# Patient Record
Sex: Male | Born: 1950 | Hispanic: No | Marital: Married | Smoking: Former smoker
Health system: Southern US, Community
[De-identification: ages and names within clinical notes are randomized; demographics above are authoritative.]

## PROBLEM LIST (undated history)

## (undated) DIAGNOSIS — I10 Essential (primary) hypertension: Secondary | ICD-10-CM

## (undated) DIAGNOSIS — E119 Type 2 diabetes mellitus without complications: Secondary | ICD-10-CM

## (undated) HISTORY — PX: BACK SURGERY: SHX140

---

## 1999-01-30 ENCOUNTER — Encounter: Payer: Self-pay | Admitting: Neurological Surgery

## 1999-01-30 ENCOUNTER — Inpatient Hospital Stay (HOSPITAL_COMMUNITY): Admission: RE | Admit: 1999-01-30 | Discharge: 1999-01-31 | Payer: Self-pay | Admitting: Neurological Surgery

## 2008-12-07 ENCOUNTER — Inpatient Hospital Stay: Payer: Self-pay | Admitting: Internal Medicine

## 2011-05-24 ENCOUNTER — Ambulatory Visit: Payer: Self-pay | Admitting: Internal Medicine

## 2011-05-25 ENCOUNTER — Ambulatory Visit: Payer: Self-pay | Admitting: Internal Medicine

## 2011-05-28 ENCOUNTER — Ambulatory Visit: Payer: Self-pay | Admitting: Internal Medicine

## 2011-09-18 ENCOUNTER — Other Ambulatory Visit: Payer: Self-pay | Admitting: Internal Medicine

## 2011-09-19 ENCOUNTER — Ambulatory Visit: Payer: Self-pay | Admitting: Internal Medicine

## 2013-01-05 ENCOUNTER — Ambulatory Visit: Payer: Self-pay | Admitting: Internal Medicine

## 2013-01-14 ENCOUNTER — Emergency Department: Payer: Self-pay | Admitting: Emergency Medicine

## 2013-01-14 LAB — BASIC METABOLIC PANEL
Anion Gap: 12 (ref 7–16)
BUN: 25 mg/dL — ABNORMAL HIGH (ref 7–18)
Calcium, Total: 8.6 mg/dL (ref 8.5–10.1)
Chloride: 100 mmol/L (ref 98–107)
Co2: 23 mmol/L (ref 21–32)
Creatinine: 1.54 mg/dL — ABNORMAL HIGH (ref 0.60–1.30)
EGFR (African American): 56 — ABNORMAL LOW
EGFR (Non-African Amer.): 48 — ABNORMAL LOW
Glucose: 199 mg/dL — ABNORMAL HIGH (ref 65–99)
Osmolality: 280 (ref 275–301)
Potassium: 5.2 mmol/L — ABNORMAL HIGH (ref 3.5–5.1)
Sodium: 135 mmol/L — ABNORMAL LOW (ref 136–145)

## 2013-01-14 LAB — URINALYSIS, COMPLETE
Bilirubin,UR: NEGATIVE
Blood: NEGATIVE
Glucose,UR: 50 mg/dL (ref 0–75)
Hyaline Cast: 4
Ketone: NEGATIVE
Leukocyte Esterase: NEGATIVE
Nitrite: NEGATIVE
Ph: 5 (ref 4.5–8.0)
Protein: NEGATIVE
RBC,UR: 1 /HPF (ref 0–5)
Specific Gravity: 1.015 (ref 1.003–1.030)
Squamous Epithelial: NONE SEEN
WBC UR: 4 /HPF (ref 0–5)

## 2013-01-14 LAB — CBC
HCT: 47.1 % (ref 40.0–52.0)
HGB: 16.2 g/dL (ref 13.0–18.0)
MCH: 29.9 pg (ref 26.0–34.0)
MCHC: 34.3 g/dL (ref 32.0–36.0)
MCV: 87 fL (ref 80–100)
Platelet: 218 10*3/uL (ref 150–440)
RBC: 5.41 10*6/uL (ref 4.40–5.90)
RDW: 13.9 % (ref 11.5–14.5)
WBC: 8.9 10*3/uL (ref 3.8–10.6)

## 2013-01-14 LAB — TROPONIN I: Troponin-I: <0.02 ng/mL

## 2013-01-14 LAB — PRO B NATRIURETIC PEPTIDE: B-Type Natriuretic Peptide: 60 pg/mL (ref 0–125)

## 2013-01-17 IMAGING — US US CAROTID DUPLEX BILAT
1 series · 17 of 24 positions shown · non-contrast
Comparison: none

REASON FOR EXAM: fatigue dizziness
COMMENTS:

[Series 1: us carotid duplex bilat · 17 of 63 slices shown]
[im 1/63]
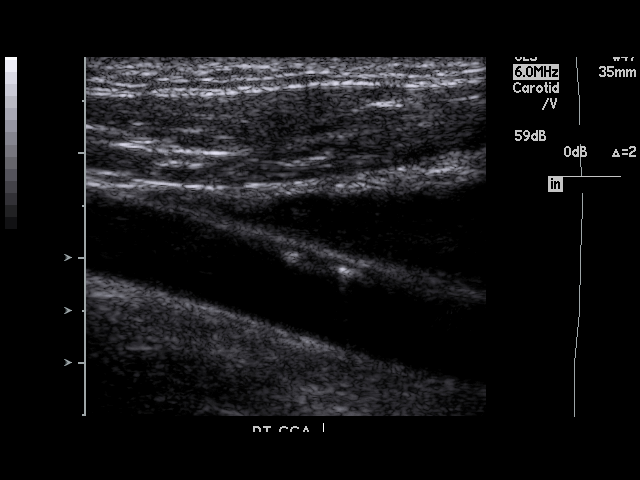
[im 6/63]
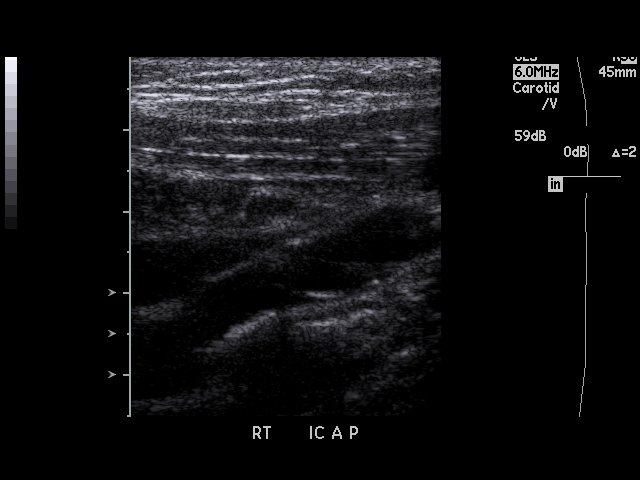
[im 9/63]
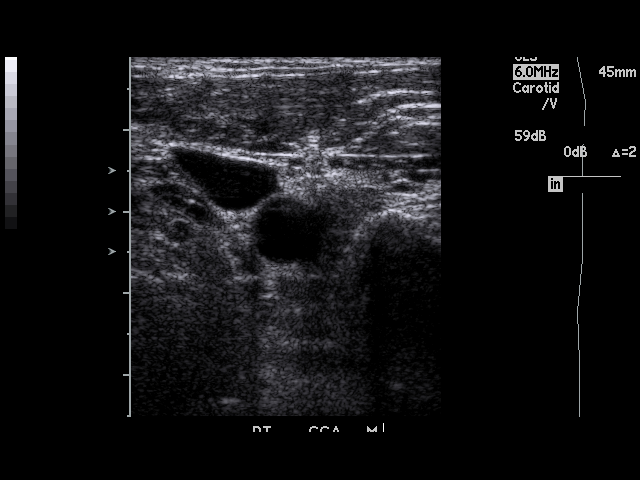
[im 11/63]
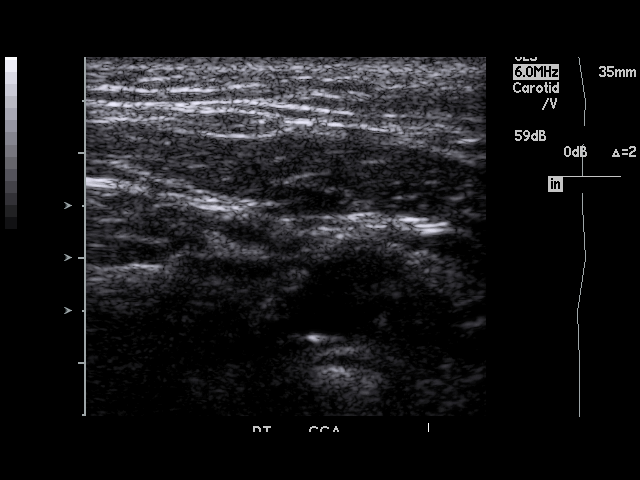
[im 17/63]
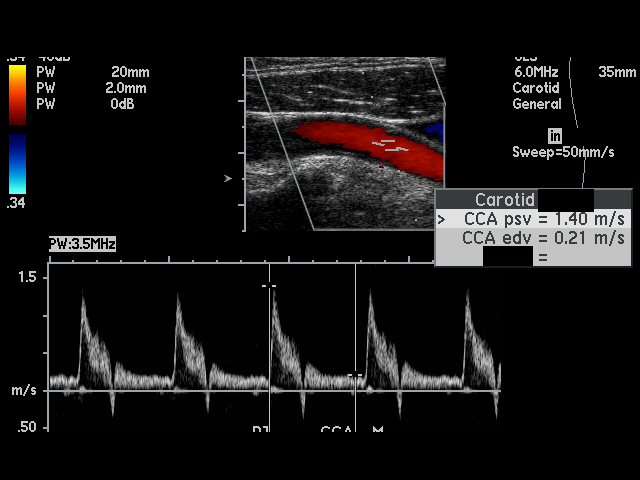
[im 19/63]
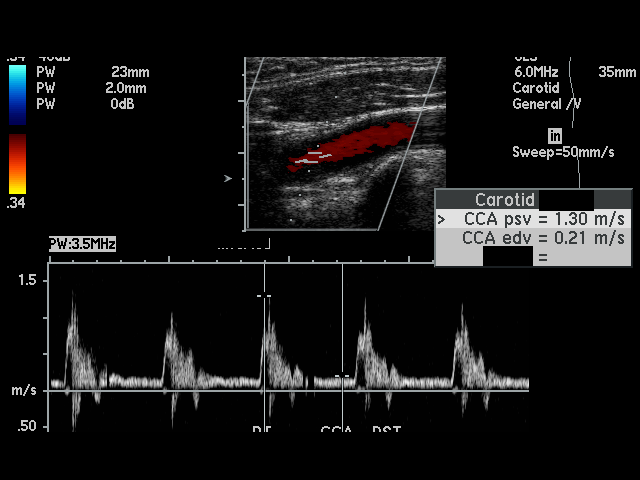
[im 25/63]
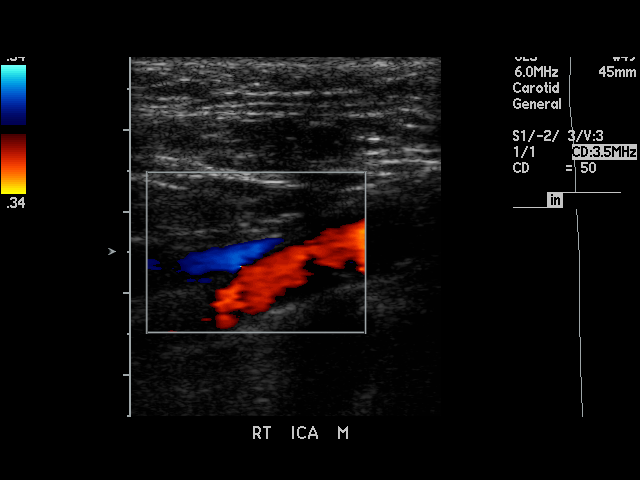
[im 27/63]
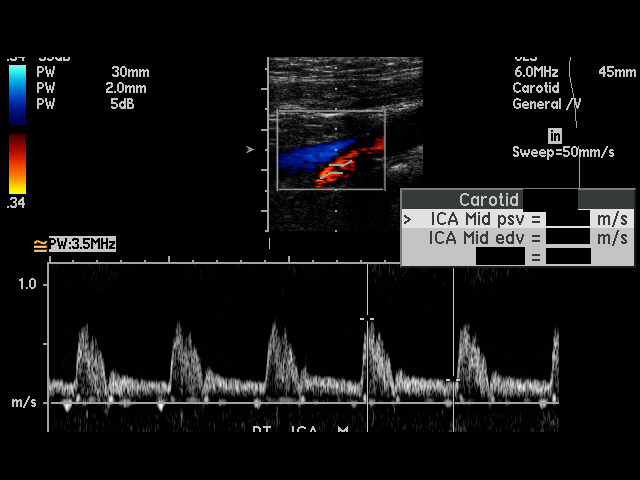
[im 33/63]
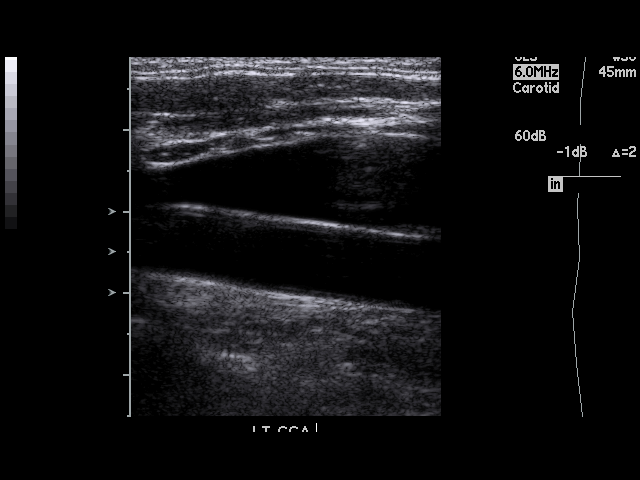
[im 36/63]
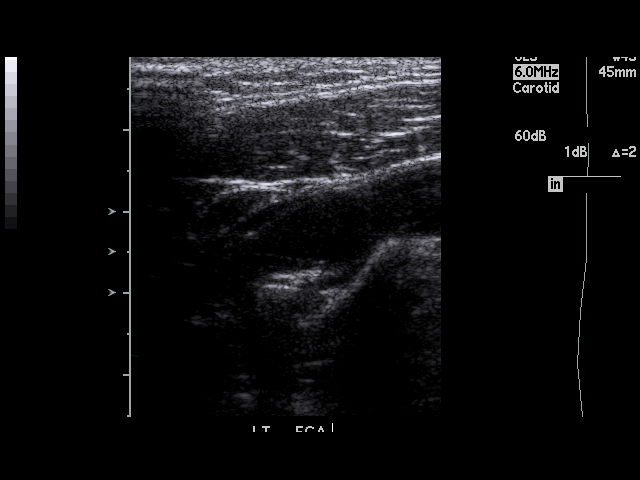
[im 38/63]
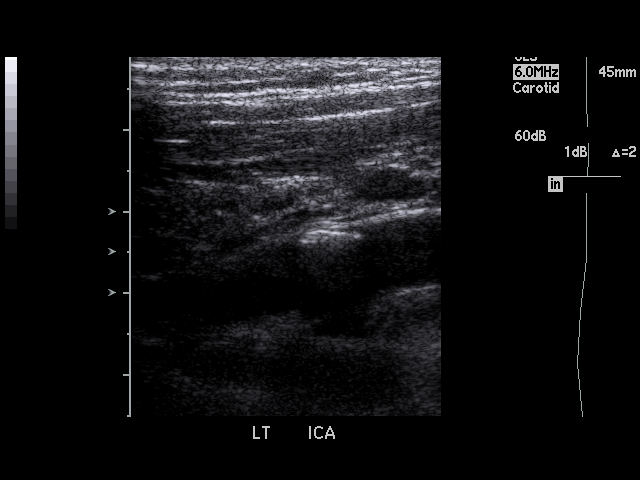
[im 44/63]
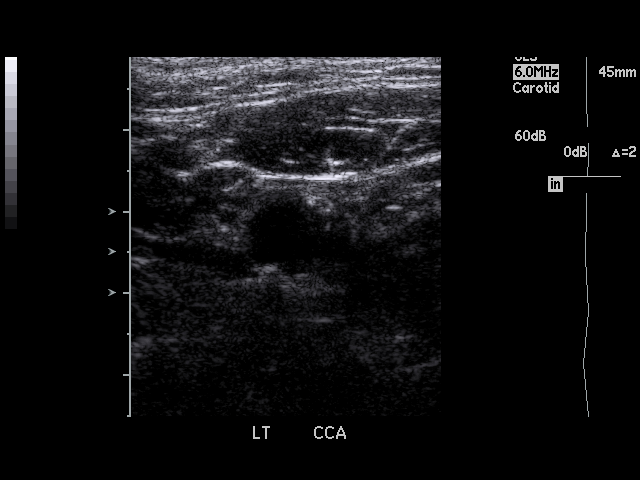
[im 46/63]
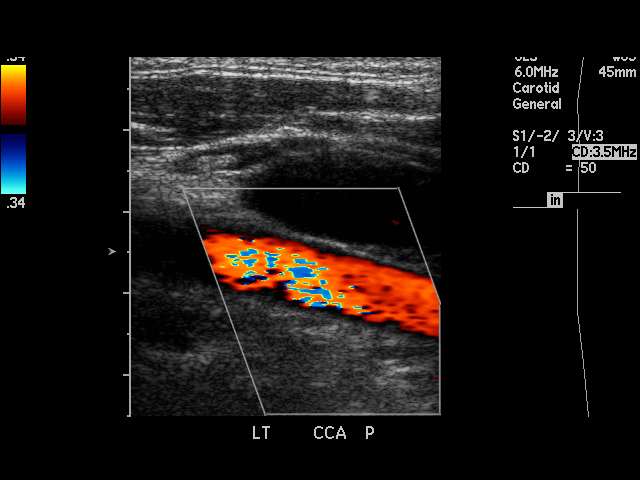
[im 52/63]
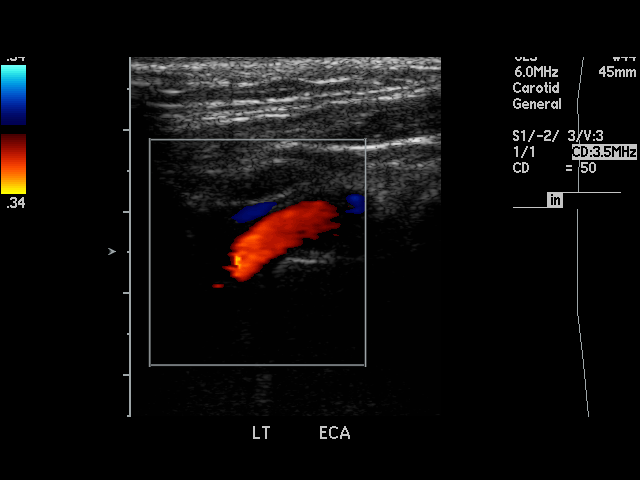
[im 54/63]
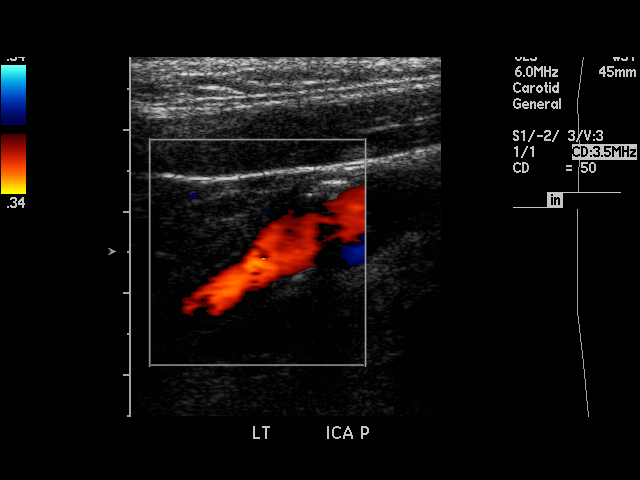
[im 57/63]
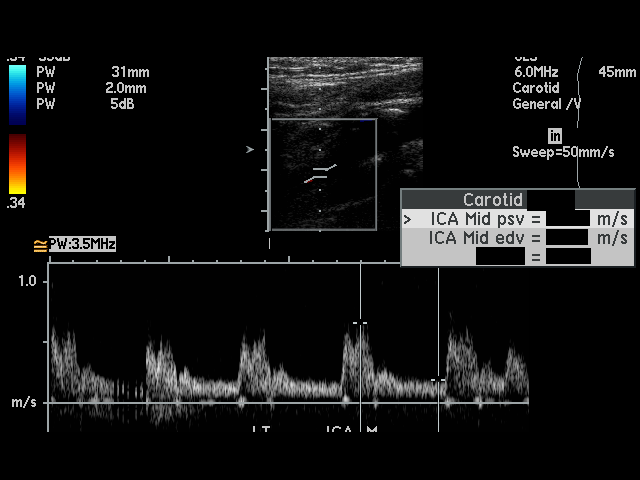
[im 63/63]
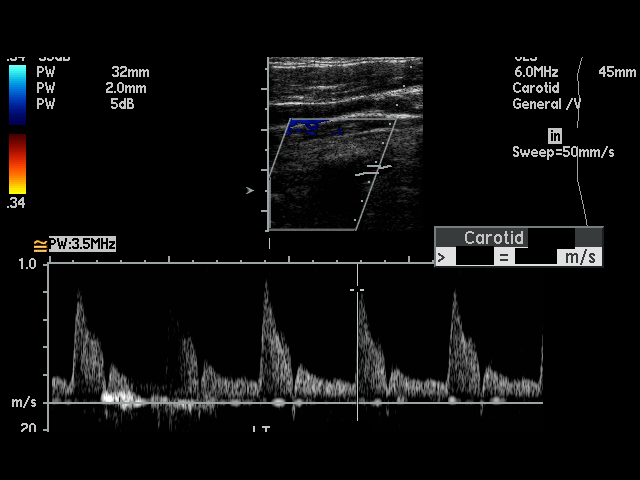

[17 of 24 positions shown; findings below may reference images not displayed]

PROCEDURE:     US  - US CAROTID DOPPLER BILATERAL  - May 28, 2011 [DATE]

RESULT:     There is observed a small amount of smooth and calcific plaque
formation about the carotid bifurcations bilaterally. In the distal right
common carotid, there is a linear density projecting into the lumen. This
does not move. The etiology for this is to me uncertain. Possibly this
represents intima that is interrupted and elevated by thrombus or soft
plaque formation. No significant stenosis is seen in the area. The finding
could be further evaluated by CTA or MRA, if clinically indicated.

On the right, the peak right common carotid artery flow velocity measures
1.3 m/sec and the peak right internal carotid artery flow velocity measures
0.717 m/sec. The ICA/CCA ratio is 0.552.

On the left, the peak left common carotid artery flow velocity measures
m/sec and the peak left internal carotid artery flow velocity measures
m/sec. The ICA/CCA ratio a 0.934.

These values bilaterally are in the normal range and are consistent with the
absence of hemodynamically significant stenosis.

There is observed antegrade flow in both vertebrals.
IMPRESSION: 1.  No hemodynamically significant stenosis is observed on either side.
2.  There is antegrade flow in both vertebrals.
3.  There is a small amount of plaque formation observed bilaterally.
4.  There is a linear echo density that projects into the lumen of the
distal right common carotid for a distance of a few millimeters. The
etiology for this is to me uncertain but may represent a focal area of
elevated intima posterior to which there is thrombus or soft plaque. The
finding could possibly be further evaluated by CTA or MRA, if clinically
indicated.

## 2017-11-18 ENCOUNTER — Encounter: Payer: Self-pay | Admitting: Emergency Medicine

## 2017-11-18 ENCOUNTER — Emergency Department
Admission: EM | Admit: 2017-11-18 | Discharge: 2017-11-18 | Disposition: A | Discharge status: Home / Self Care | Payer: Medicare Other | Attending: Student in an Organized Health Care Education/Training Program | Admitting: Student in an Organized Health Care Education/Training Program

## 2017-11-18 ENCOUNTER — Other Ambulatory Visit: Payer: Self-pay

## 2017-11-18 ENCOUNTER — Emergency Department: Payer: Medicare Other

## 2017-11-18 DIAGNOSIS — Z87891 Personal history of nicotine dependence: Secondary | ICD-10-CM | POA: Diagnosis not present

## 2017-11-18 DIAGNOSIS — E119 Type 2 diabetes mellitus without complications: Secondary | ICD-10-CM | POA: Diagnosis not present

## 2017-11-18 DIAGNOSIS — Z7982 Long term (current) use of aspirin: Secondary | ICD-10-CM | POA: Insufficient documentation

## 2017-11-18 DIAGNOSIS — R0602 Shortness of breath: Secondary | ICD-10-CM | POA: Insufficient documentation

## 2017-11-18 DIAGNOSIS — R0789 Other chest pain: Secondary | ICD-10-CM | POA: Insufficient documentation

## 2017-11-18 DIAGNOSIS — I10 Essential (primary) hypertension: Secondary | ICD-10-CM | POA: Diagnosis not present

## 2017-11-18 DIAGNOSIS — R000 Tachycardia, unspecified: Secondary | ICD-10-CM | POA: Diagnosis not present

## 2017-11-18 DIAGNOSIS — Z79899 Other long term (current) drug therapy: Secondary | ICD-10-CM | POA: Diagnosis not present

## 2017-11-18 DIAGNOSIS — I159 Secondary hypertension, unspecified: Secondary | ICD-10-CM | POA: Diagnosis not present

## 2017-11-18 DIAGNOSIS — R079 Chest pain, unspecified: Secondary | ICD-10-CM | POA: Diagnosis not present

## 2017-11-18 DIAGNOSIS — Z794 Long term (current) use of insulin: Secondary | ICD-10-CM | POA: Diagnosis not present

## 2017-11-18 HISTORY — DX: Type 2 diabetes mellitus without complications: E11.9

## 2017-11-18 HISTORY — DX: Essential (primary) hypertension: I10

## 2017-11-18 LAB — COMPREHENSIVE METABOLIC PANEL
ALK PHOS: 54 U/L (ref 38–126)
ALT: 96 U/L — ABNORMAL HIGH (ref 17–63)
ANION GAP: 13 (ref 5–15)
AST: 56 U/L — ABNORMAL HIGH (ref 15–41)
Albumin: 4.3 g/dL (ref 3.5–5.0)
BILIRUBIN TOTAL: 0.5 mg/dL (ref 0.3–1.2)
BUN: 10 mg/dL (ref 6–20)
CALCIUM: 9.3 mg/dL (ref 8.9–10.3)
CO2: 26 mmol/L (ref 22–32)
Chloride: 89 mmol/L — ABNORMAL LOW (ref 101–111)
Creatinine, Ser: 0.84 mg/dL (ref 0.61–1.24)
GFR calc Af Amer: >60 mL/min (ref >60)
GLUCOSE: 254 mg/dL — AB (ref 65–99)
Potassium: 3.8 mmol/L (ref 3.5–5.1)
Sodium: 128 mmol/L — ABNORMAL LOW (ref 135–145)
TOTAL PROTEIN: 7.7 g/dL (ref 6.5–8.1)

## 2017-11-18 LAB — TROPONIN I
Troponin I: <0.03 ng/mL (ref <0.03)
Troponin I: <0.03 ng/mL (ref <0.03)

## 2017-11-18 LAB — CBC
HEMATOCRIT: 49 % (ref 40.0–52.0)
Hemoglobin: 16.7 g/dL (ref 13.0–18.0)
MCH: 29.2 pg (ref 26.0–34.0)
MCHC: 34.1 g/dL (ref 32.0–36.0)
MCV: 85.5 fL (ref 80.0–100.0)
PLATELETS: 239 10*3/uL (ref 150–440)
RBC: 5.73 MIL/uL (ref 4.40–5.90)
RDW: 15 % — AB (ref 11.5–14.5)
WBC: 9.5 10*3/uL (ref 3.8–10.6)

## 2017-11-18 LAB — FIBRIN DERIVATIVES D-DIMER (ARMC ONLY): FIBRIN DERIVATIVES D-DIMER (ARMC): 351.18 ng{FEU}/mL (ref 0.00–499.00)

## 2017-11-18 MED ORDER — PREDNISONE 10 MG PO TABS: 10.0000 mg | ORAL_TABLET | Freq: Every day | ORAL | 0 refills | Status: DC

## 2017-11-18 MED ORDER — AMLODIPINE BESYLATE 5 MG PO TABS: 5.0000 mg | ORAL_TABLET | Freq: Every day | ORAL | 0 refills | Status: AC

## 2017-11-18 MED ORDER — HYDROCODONE-ACETAMINOPHEN 5-325 MG PO TABS: 1.0000 | ORAL_TABLET | Freq: Once | ORAL | Status: DC

## 2017-11-18 MED ORDER — SODIUM CHLORIDE 0.9 % IV BOLUS (SEPSIS)
500.0000 mL | Freq: Once | INTRAVENOUS | Status: AC
  Administered 2017-11-18: 500 mL via INTRAVENOUS

## 2017-11-18 MED ORDER — IPRATROPIUM-ALBUTEROL 0.5-2.5 (3) MG/3ML IN SOLN
3.0000 mL | Freq: Once | RESPIRATORY_TRACT | Status: AC
  Administered 2017-11-18: 3 mL via RESPIRATORY_TRACT
  Filled 2017-11-18: qty 3

## 2017-11-18 MED ORDER — AMLODIPINE BESYLATE 5 MG PO TABS
10.0000 mg | ORAL_TABLET | Freq: Once | ORAL | Status: AC
  Administered 2017-11-18: 10 mg via ORAL
  Filled 2017-11-18: qty 2

## 2017-11-18 NOTE — ED Provider Notes (Signed)
[facility] Emergency Department Provider Note    First MD Initiated Contact with Patient 11/18/17 1158     (approximate)  I have reviewed the triage vital signs and the nursing notes.   HISTORY  Chief Complaint Chest Pain    HPI Hector Braun is a 67 y.o. male a history of diabetes and hypertension presents with several months of intermittent anterior chest pain associated with shortness of breath.  Became more severe this morning which brought him to the ER.  States he was sitting at home and went to go get wood from the fire stack roughly 10-15 feet away from his chair.  He picked at the water and threw it down and sat down and started feeling short of breath.  Was complaining of associated sharp stabbing chest pain.  No diaphoresis nausea or vomiting.  Does have a significant history of smoking.  Does not currently smoke.  Denies any history of heart attack.  Recently has had multiple medication changes for his blood pressure management.  Blood pressure checked this morning was 200/100.  No lower extremity swelling.  No orthopnea.  Past Medical History:  Diagnosis Date  . Diabetes mellitus without complication (HCC)   . Hypertension    No family history on file. Past Surgical History:  Procedure Laterality Date  . BACK SURGERY     There are no active problems to display for this patient.     Prior to Admission medications   Medication Sig Start Date End Date Taking? Authorizing Provider  albuterol (PROVENTIL HFA;VENTOLIN HFA) 108 (90 Base) MCG/ACT inhaler Inhale 2 puffs into the lungs every 6 (six) hours as needed for wheezing or shortness of breath.   Yes [provider]  aspirin EC 81 MG tablet Take 81 mg by mouth daily.   Yes [provider]  cholecalciferol (VITAMIN D) 1000 units tablet Take 2,000 Units by mouth daily.   Yes [provider]  hydrochlorothiazide (HYDRODIURIL) 25 MG tablet Take 25 mg by mouth daily.    Yes [provider]  insulin aspart (NOVOLOG) 100 UNIT/ML injection Inject 10-15 Units into the skin 3 (three) times daily before meals.   Yes [provider]  insulin detemir (LEVEMIR) 100 UNIT/ML injection Inject 60-80 Units into the skin 2 (two) times daily. 60UNITS-AM/80UNITS-PM   Yes [provider]  losartan (COZAAR) 50 MG tablet Take 50 mg by mouth daily.   Yes [provider]  metFORMIN (GLUCOPHAGE) 1000 MG tablet Take 1,000 mg by mouth 2 (two) times daily with a meal.   Yes [provider]  metoprolol tartrate (LOPRESSOR) 25 MG tablet Take 25 mg by mouth 2 (two) times daily.   Yes [provider]  omeprazole (PRILOSEC) 20 MG capsule Take 20 mg by mouth daily.   Yes [provider]  predniSONE (DELTASONE) 10 MG tablet Take 1 tablet (10 mg total) by mouth daily. Day 1-2: Take 50 mg  ( 5 pills) Day 3-4 : Take 40 mg (4pills) Day 5-6: Take 30 mg (3 pills) Day 7-8:  Take 20 mg (2 pills) Day 9:  Take 10mg  (1 pill) 11/18/17   Hector Eddy, MD    Allergies Lisinopril    Social History Social History   Tobacco Use  . Smoking status: Former Smoker  Substance Use Topics  . Alcohol use: Not on file  . Drug use: Not on file    Review of Systems Patient denies headaches, rhinorrhea, blurry vision, numbness, shortness of  breath, chest pain, edema, cough, abdominal pain, nausea, vomiting, diarrhea, dysuria, fevers, rashes or hallucinations unless otherwise stated above in HPI. ____________________________________________   PHYSICAL EXAM:  VITAL SIGNS: Vitals:   11/18/17 1430 11/18/17 1515  BP: (!) 149/80 (!) 161/94  Pulse: 85 97  Resp: (!) 21 (!) 23  Temp:    SpO2: 97% 97%    Constitutional: Alert and oriented. Well appearing and in no acute distress. Eyes: Conjunctivae are normal.  Head: Atraumatic. Nose: No congestion/rhinnorhea. Mouth/Throat: Mucous membranes are moist.   Neck: No stridor. Painless ROM.    Cardiovascular: Normal rate, regular rhythm. Grossly normal heart sounds.  Good peripheral circulation. Respiratory: Normal respiratory effort.  No retractions. Lungs with coarse breathsounds throughout Gastrointestinal: Soft and nontender. No distention. No abdominal bruits. No CVA tenderness. Genitourinary:  Musculoskeletal: No lower extremity tenderness nor edema.  No joint effusions. Neurologic:  Normal speech and language. No gross focal neurologic deficits are appreciated. No facial droop Skin:  Skin is warm, dry and intact. No rash noted. Psychiatric: Mood and affect are normal. Speech and behavior are normal.  ____________________________________________   LABS (all labs ordered are listed, but only abnormal results are displayed)  Results for orders placed or performed during the hospital encounter of 11/18/17 (from the past 24 hour(s))  CBC     Status: Abnormal   Collection Time: 11/18/17 11:34 AM  Result Value Ref Range   WBC 9.5 3.8 - 10.6 K/uL   RBC 5.73 4.40 - 5.90 MIL/uL   Hemoglobin 16.7 13.0 - 18.0 g/dL   HCT 16.149.0 09.640.0 - 04.552.0 %   MCV 85.5 80.0 - 100.0 fL   MCH 29.2 26.0 - 34.0 pg   MCHC 34.1 32.0 - 36.0 g/dL   RDW 40.915.0 (H) 81.111.5 - 91.414.5 %   Platelets 239 150 - 440 K/uL  Troponin I     Status: None   Collection Time: 11/18/17 11:34 AM  Result Value Ref Range   Troponin I <0.03 <0.03 ng/mL  Comprehensive metabolic panel     Status: Abnormal   Collection Time: 11/18/17 11:34 AM  Result Value Ref Range   Sodium 128 (L) 135 - 145 mmol/L   Potassium 3.8 3.5 - 5.1 mmol/L   Chloride 89 (L) 101 - 111 mmol/L   CO2 26 22 - 32 mmol/L   Glucose, Bld 254 (H) 65 - 99 mg/dL   BUN 10 6 - 20 mg/dL   Creatinine, Ser 7.820.84 0.61 - 1.24 mg/dL   Calcium 9.3 8.9 - 95.610.3 mg/dL   Total Protein 7.7 6.5 - 8.1 g/dL   Albumin 4.3 3.5 - 5.0 g/dL   AST 56 (H) 15 - 41 U/L   ALT 96 (H) 17 - 63 U/L   Alkaline Phosphatase 54 38 - 126 U/L   Total Bilirubin 0.5 0.3 - 1.2 mg/dL   GFR calc non  Af Amer >60 >60 mL/min   GFR calc Af Amer >60 >60 mL/min   Anion gap 13 5 - 15  Fibrin derivatives D-Dimer ([facility] only)     Status: None   Collection Time: 11/18/17 12:37 PM  Result Value Ref Range   Fibrin derivatives D-dimer ([facility]) 351.18 0.00 - 499.00 ng/mL (FEU)  Troponin I     Status: None   Collection Time: 11/18/17  2:43 PM  Result Value Ref Range   Troponin I <0.03 <0.03 ng/mL   ____________________________________________  EKG My review and personal interpretation at Time: 11:29   Indication: htn  Rate: 11-  Rhythm: sinus Axis: normal Other: normal intervals, no stemi, sinus dysrhythmia ____________________________________________  RADIOLOGY  I personally reviewed all radiographic images ordered to evaluate for the above acute complaints and reviewed radiology reports and findings.  These findings were personally discussed with the patient.  Please see medical record for radiology report.  ____________________________________________   PROCEDURES  Procedure(s) performed:  Procedures    Critical Care performed: no ____________________________________________   INITIAL IMPRESSION / ASSESSMENT AND PLAN / ED COURSE  Pertinent labs & imaging results that were available during my care of the patient were reviewed by me and considered in my medical decision making (see chart for details).  DDX: ACS, pericarditis, esophagitis, boerhaaves, pe, dissection, pna, bronchitis, costochondritis   Hector Braun is a 67 y.o. who presents to the ED with chest pain as described above.  Patient is hypertensive but in no acute distress.  No hypoxia or evidence of respiratory distress.  Very atypical description chest pain.  Patient with heart score of 3 versus 4 based on subjectivity therefore will repeat troponin to further risk stratify.  EKG shows no evidence of acute ischemia.  Patient is low risk by Wells criteria.  Will order d-dimer to further stratify for pulmonary embolism.   Not clinically consistent with dissection.  The patient will be placed on continuous pulse oximetry and telemetry for monitoring.  Laboratory evaluation will be sent to evaluate for the above complaints.     Clinical Course as of Nov 18 1532  Mon Nov 18, 2017  1355 D-dimer is negative.  Currently awaiting troponin.  Patient remains Heema dynamically stable.  Blood pressure did improve after simple observation.  [PR]  1521 Troponin is negative.  At this point do believe patient is stable and appropriate for follow-up as an outpatient.  He is chest pain-free.  KG shows no acute changes.  Patient able to ambulate with steady gait.  Have discussed with the patient and available family all diagnostics and treatments performed thus far and all questions were answered to the best of my ability. The patient demonstrates understanding and agreement with plan.   [PR]    Clinical Course User Index [PR] Hector Eddy, MD     ____________________________________________   FINAL CLINICAL IMPRESSION(S) / ED DIAGNOSES  Final diagnoses:  Chest pain, unspecified type  Secondary hypertension      NEW MEDICATIONS STARTED DURING THIS VISIT:  New Prescriptions   PREDNISONE (DELTASONE) 10 MG TABLET    Take 1 tablet (10 mg total) by mouth daily. Day 1-2: Take 50 mg  ( 5 pills) Day 3-4 : Take 40 mg (4pills) Day 5-6: Take 30 mg (3 pills) Day 7-8:  Take 20 mg (2 pills) Day 9:  Take 10mg  (1 pill)     Note:  This document was prepared using Dragon voice recognition software and may include unintentional dictation errors.    Hector Eddy, MD 11/18/17 1534

## 2017-11-18 NOTE — ED Notes (Signed)
BP med was given.

## 2017-11-18 NOTE — ED Triage Notes (Addendum)
Chest pain increased this am, states has had some chest pain for months. States has episodes at home of feeling like his heart rate speeds up, monitors his heart rate at that time and notes irregular heartrate and blood pressure dropping. Lasts for 10 min to 2 hours.

## 2017-11-19 ENCOUNTER — Telehealth: Payer: Self-pay

## 2017-11-19 NOTE — Telephone Encounter (Signed)
Spoke to patient He states he does not want to make a follow up   Nothing else needed.

## 2018-04-15 ENCOUNTER — Ambulatory Visit: Payer: Self-pay | Admitting: Podiatry

## 2018-04-17 ENCOUNTER — Encounter: Payer: Self-pay | Admitting: Podiatry

## 2018-04-17 ENCOUNTER — Ambulatory Visit (INDEPENDENT_AMBULATORY_CARE_PROVIDER_SITE_OTHER): Payer: Medicare Other | Admitting: Podiatry

## 2018-04-17 DIAGNOSIS — B351 Tinea unguium: Secondary | ICD-10-CM

## 2018-04-17 DIAGNOSIS — M79675 Pain in left toe(s): Secondary | ICD-10-CM | POA: Diagnosis not present

## 2018-04-17 DIAGNOSIS — M79674 Pain in right toe(s): Secondary | ICD-10-CM | POA: Diagnosis not present

## 2018-04-17 DIAGNOSIS — E1142 Type 2 diabetes mellitus with diabetic polyneuropathy: Secondary | ICD-10-CM

## 2018-04-17 NOTE — Progress Notes (Signed)
This patient presents to the office with chief complaint of long thick nails and diabetic feet.  This patient  says there  is  no pain and discomfort in their feet.  This patient says there are long thick painful nails.  These nails are painful walking and wearing shoes.  Patient has no history of infection or drainage from both feet.  Patient is unable to  self treat his own nails .  [facility]  This patient presents  to the office today for treatment of the  long nails and a foot evaluation due to history of  diabetes.  General Appearance  Alert, conversant and in no acute stress.  Vascular  Dorsalis pedis and posterior tibial  pulses are palpable  bilaterally.  Capillary return is within normal limits  bilaterally. Temperature is within normal limits  bilaterally.  Neurologic  Senn-Weinstein monofilament wire test absent   bilaterally. Muscle power within normal limits bilaterally.  Nails Thick disfigured discolored nails with subungual debris  from hallux to fifth toes bilaterally. No evidence of bacterial infection or drainage bilaterally. Pincer nails  B/L  Orthopedic  No limitations of motion of motion feet .  No crepitus or effusions noted.  DJD 1st MPJ  B/L.  Skin  normotropic skin with no porokeratosis noted bilaterally.  No signs of infections or ulcers noted.   Asymptomatic heel callus.  Onychomycosis  Diabetes with no foot complications  IE  Debride nails x 10.  A diabetic foot exam was performed and there is no evidence of any vascular pathology.  Absent LOPS noted.   RTC 3 months.   Helane GuntherGregory Latarshia Jersey DPM

## 2018-07-21 ENCOUNTER — Ambulatory Visit (INDEPENDENT_AMBULATORY_CARE_PROVIDER_SITE_OTHER): Payer: No Typology Code available for payment source | Admitting: Podiatry

## 2018-07-21 ENCOUNTER — Encounter: Payer: Self-pay | Admitting: Podiatry

## 2018-07-21 DIAGNOSIS — B351 Tinea unguium: Secondary | ICD-10-CM

## 2018-07-21 DIAGNOSIS — M79675 Pain in left toe(s): Secondary | ICD-10-CM | POA: Diagnosis not present

## 2018-07-21 DIAGNOSIS — M79674 Pain in right toe(s): Secondary | ICD-10-CM | POA: Diagnosis not present

## 2018-07-21 DIAGNOSIS — E1142 Type 2 diabetes mellitus with diabetic polyneuropathy: Secondary | ICD-10-CM | POA: Diagnosis not present

## 2018-07-21 NOTE — Progress Notes (Signed)
Complaint:  Visit Type: Patient returns to my office for continued preventative foot care services. Complaint: Patient states" my nails have grown long and thick and become painful to walk and wear shoes" Patient has been diagnosed with DM with no foot complications. The patient presents for preventative foot care services. No changes to ROS  Podiatric Exam: Vascular: dorsalis pedis and posterior tibial pulses are palpable bilateral. Capillary return is immediate. Temperature gradient is WNL. Skin turgor WNL  Sensorium: Absent  Semmes Weinstein monofilament test. Normal tactile sensation bilaterally. Nail Exam: Pt has thick disfigured discolored nails with subungual debris noted bilateral entire nail hallux through fifth toenails.  Pincer nails  B/l. Ulcer Exam: There is no evidence of ulcer or pre-ulcerative changes or infection. Orthopedic Exam: Muscle tone and strength are WNL. No limitations in general ROM. No crepitus or effusions noted. Foot type and digits show no abnormalities. Bony prominences are unremarkable. Skin: No Porokeratosis. No infection or ulcers  Diagnosis:  Onychomycosis, , Pain in right toe, pain in left toes  Treatment & Plan Procedures and Treatment: Consent by patient was obtained for treatment procedures.   Debridement of mycotic and hypertrophic toenails, 1 through 5 bilateral and clearing of subungual debris. No ulceration, no infection noted.  Return Visit-Office Procedure: Patient instructed to return to the office for a follow up visit 3 months for continued evaluation and treatment.    Helane GuntherGregory Renda Pohlman DPM

## 2018-07-24 ENCOUNTER — Telehealth: Payer: Self-pay | Admitting: Podiatry

## 2018-07-24 ENCOUNTER — Ambulatory Visit: Payer: Non-veteran care | Admitting: Podiatry

## 2018-07-24 NOTE — Telephone Encounter (Signed)
[facility] [phone] Attn: Team C

## 2018-10-20 ENCOUNTER — Encounter: Payer: Self-pay | Admitting: Podiatry

## 2018-10-20 ENCOUNTER — Ambulatory Visit (INDEPENDENT_AMBULATORY_CARE_PROVIDER_SITE_OTHER): Payer: No Typology Code available for payment source | Admitting: Podiatry

## 2018-10-20 DIAGNOSIS — M79674 Pain in right toe(s): Secondary | ICD-10-CM | POA: Diagnosis not present

## 2018-10-20 DIAGNOSIS — B351 Tinea unguium: Secondary | ICD-10-CM

## 2018-10-20 DIAGNOSIS — E1142 Type 2 diabetes mellitus with diabetic polyneuropathy: Secondary | ICD-10-CM | POA: Diagnosis not present

## 2018-10-20 DIAGNOSIS — M79675 Pain in left toe(s): Secondary | ICD-10-CM

## 2018-10-20 NOTE — Progress Notes (Signed)
Complaint:  Visit Type: Patient returns to my office for continued preventative foot care services. Complaint: Patient states" my nails have grown long and thick and become painful to walk and wear shoes" Patient has been diagnosed with DM with no foot complications. The patient presents for preventative foot care services. No changes to ROS  Podiatric Exam: Vascular: dorsalis pedis and posterior tibial pulses are minimally  palpable bilateral. Capillary return is immediate. Cold feet B/L especially toes  B/L. Skin turgor WNL  Sensorium: Absent  Semmes Weinstein monofilament test. Normal tactile sensation bilaterally. Nail Exam: Pt has thick disfigured discolored nails with subungual debris noted bilateral entire nail hallux through fifth toenails.  Pincer nails  B/l. Ulcer Exam: There is no evidence of ulcer or pre-ulcerative changes or infection. Orthopedic Exam: Muscle tone and strength are WNL. No limitations in general ROM. No crepitus or effusions noted. Foot type and digits show no abnormalities. Bony prominences are unremarkable. Skin: No Porokeratosis. No infection or ulcers  Diagnosis:  Onychomycosis, , Pain in right toe, pain in left toes  Treatment & Plan Procedures and Treatment: Consent by patient was obtained for treatment procedures.   Debridement of mycotic and hypertrophic toenails, 1 through 5 bilateral and clearing of subungual debris. No ulceration, no infection noted. Told patient he is ceveloping skin lesions which are probably due to his circulation.  [facility] Return Visit-Office Procedure: Patient instructed to return to the office for a follow up visit 3 months for continued evaluation and treatment.    Helane GuntherGregory Keenon Leitzel DPM

## 2018-10-23 ENCOUNTER — Telehealth: Payer: Self-pay | Admitting: Podiatry

## 2018-10-23 NOTE — Telephone Encounter (Signed)
Pt called and stated that we are sending the bill to the wrong place and he just wants to make sure we get paid. He said [place] [facility] [place]. There number is [phone].

## 2019-01-19 ENCOUNTER — Ambulatory Visit: Payer: Non-veteran care | Admitting: Podiatry

## 2019-04-13 ENCOUNTER — Ambulatory Visit (INDEPENDENT_AMBULATORY_CARE_PROVIDER_SITE_OTHER): Payer: Medicare Other | Admitting: Podiatry

## 2019-04-13 ENCOUNTER — Other Ambulatory Visit: Payer: Self-pay

## 2019-04-13 ENCOUNTER — Encounter: Payer: Self-pay | Admitting: Podiatry

## 2019-04-13 DIAGNOSIS — M79675 Pain in left toe(s): Secondary | ICD-10-CM | POA: Diagnosis not present

## 2019-04-13 DIAGNOSIS — M79674 Pain in right toe(s): Secondary | ICD-10-CM | POA: Diagnosis not present

## 2019-04-13 DIAGNOSIS — E1142 Type 2 diabetes mellitus with diabetic polyneuropathy: Secondary | ICD-10-CM

## 2019-04-13 DIAGNOSIS — B351 Tinea unguium: Secondary | ICD-10-CM | POA: Insufficient documentation

## 2019-04-13 DIAGNOSIS — E1140 Type 2 diabetes mellitus with diabetic neuropathy, unspecified: Secondary | ICD-10-CM | POA: Insufficient documentation

## 2019-04-13 NOTE — Progress Notes (Signed)
Complaint:  Visit Type: Patient returns to my office for continued preventative foot care services. Complaint: Patient states" my nails have grown long and thick and become painful to walk and wear shoes" Patient has been diagnosed with DM with no foot complications. The patient presents for preventative foot care services. No changes to ROS  Podiatric Exam: Vascular: dorsalis pedis and posterior tibial pulses are minimally  palpable bilateral. Capillary return is immediate. Cold feet B/L especially toes  B/L. Skin turgor WNL  Sensorium: Absent  Semmes Weinstein monofilament test. Normal tactile sensation bilaterally. Nail Exam: Pt has thick disfigured discolored nails with subungual debris noted bilateral entire nail hallux through fifth toenails.  Pincer nails  B/l. Ulcer Exam: There is no evidence of ulcer or pre-ulcerative changes or infection. Orthopedic Exam: Muscle tone and strength are WNL. No limitations in general ROM. No crepitus or effusions noted. Foot type and digits show no abnormalities. Bony prominences are unremarkable. Skin: No Porokeratosis. No infection or ulcers  Diagnosis:  Onychomycosis, , Pain in right toe, pain in left toes  Diabetes with neuropathy  Treatment & Plan Procedures and Treatment: Consent by patient was obtained for treatment procedures.   Debridement of mycotic and hypertrophic toenails, 1 through 5 bilateral and clearing of subungual debris. No ulceration, no infection noted. Patient says he had vascular studies performed at [facility] and vascular status  WNL. Return Visit-Office Procedure: Patient instructed to return to the office for a follow up visit 4 months for continued evaluation and treatment.    Gardiner Barefoot DPM

## 2019-07-11 IMAGING — CR DG CHEST 2V
1 series · 2 of 2 positions shown · non-contrast
Comparison: 01/14/2013

CLINICAL DATA: Shortness of Breath

EXAM:
CHEST  2 VIEW

[Series 1: dg chest 2 view · 0.14mm/px · 2 of 2 slices shown]
[im 1/2]
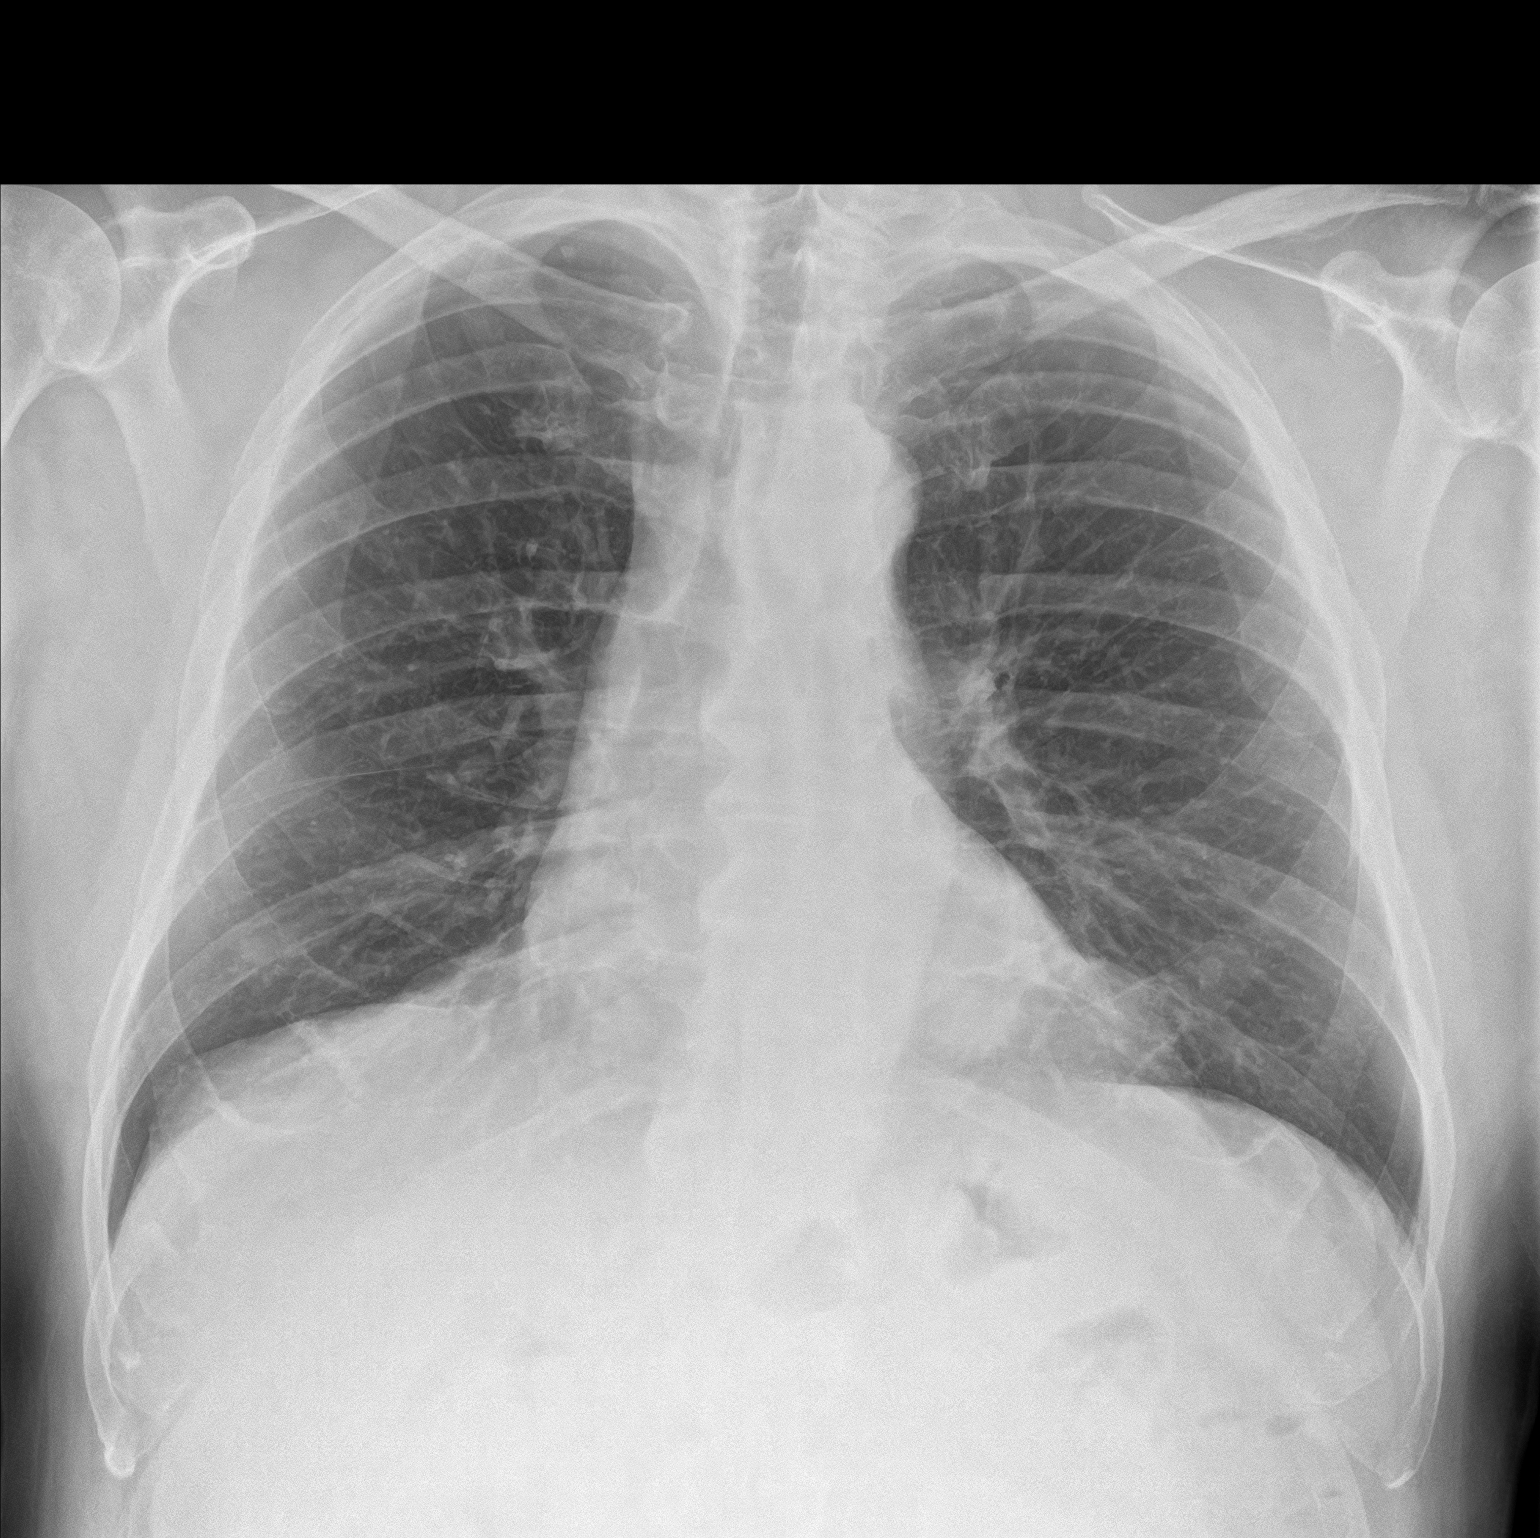
[im 2/2]
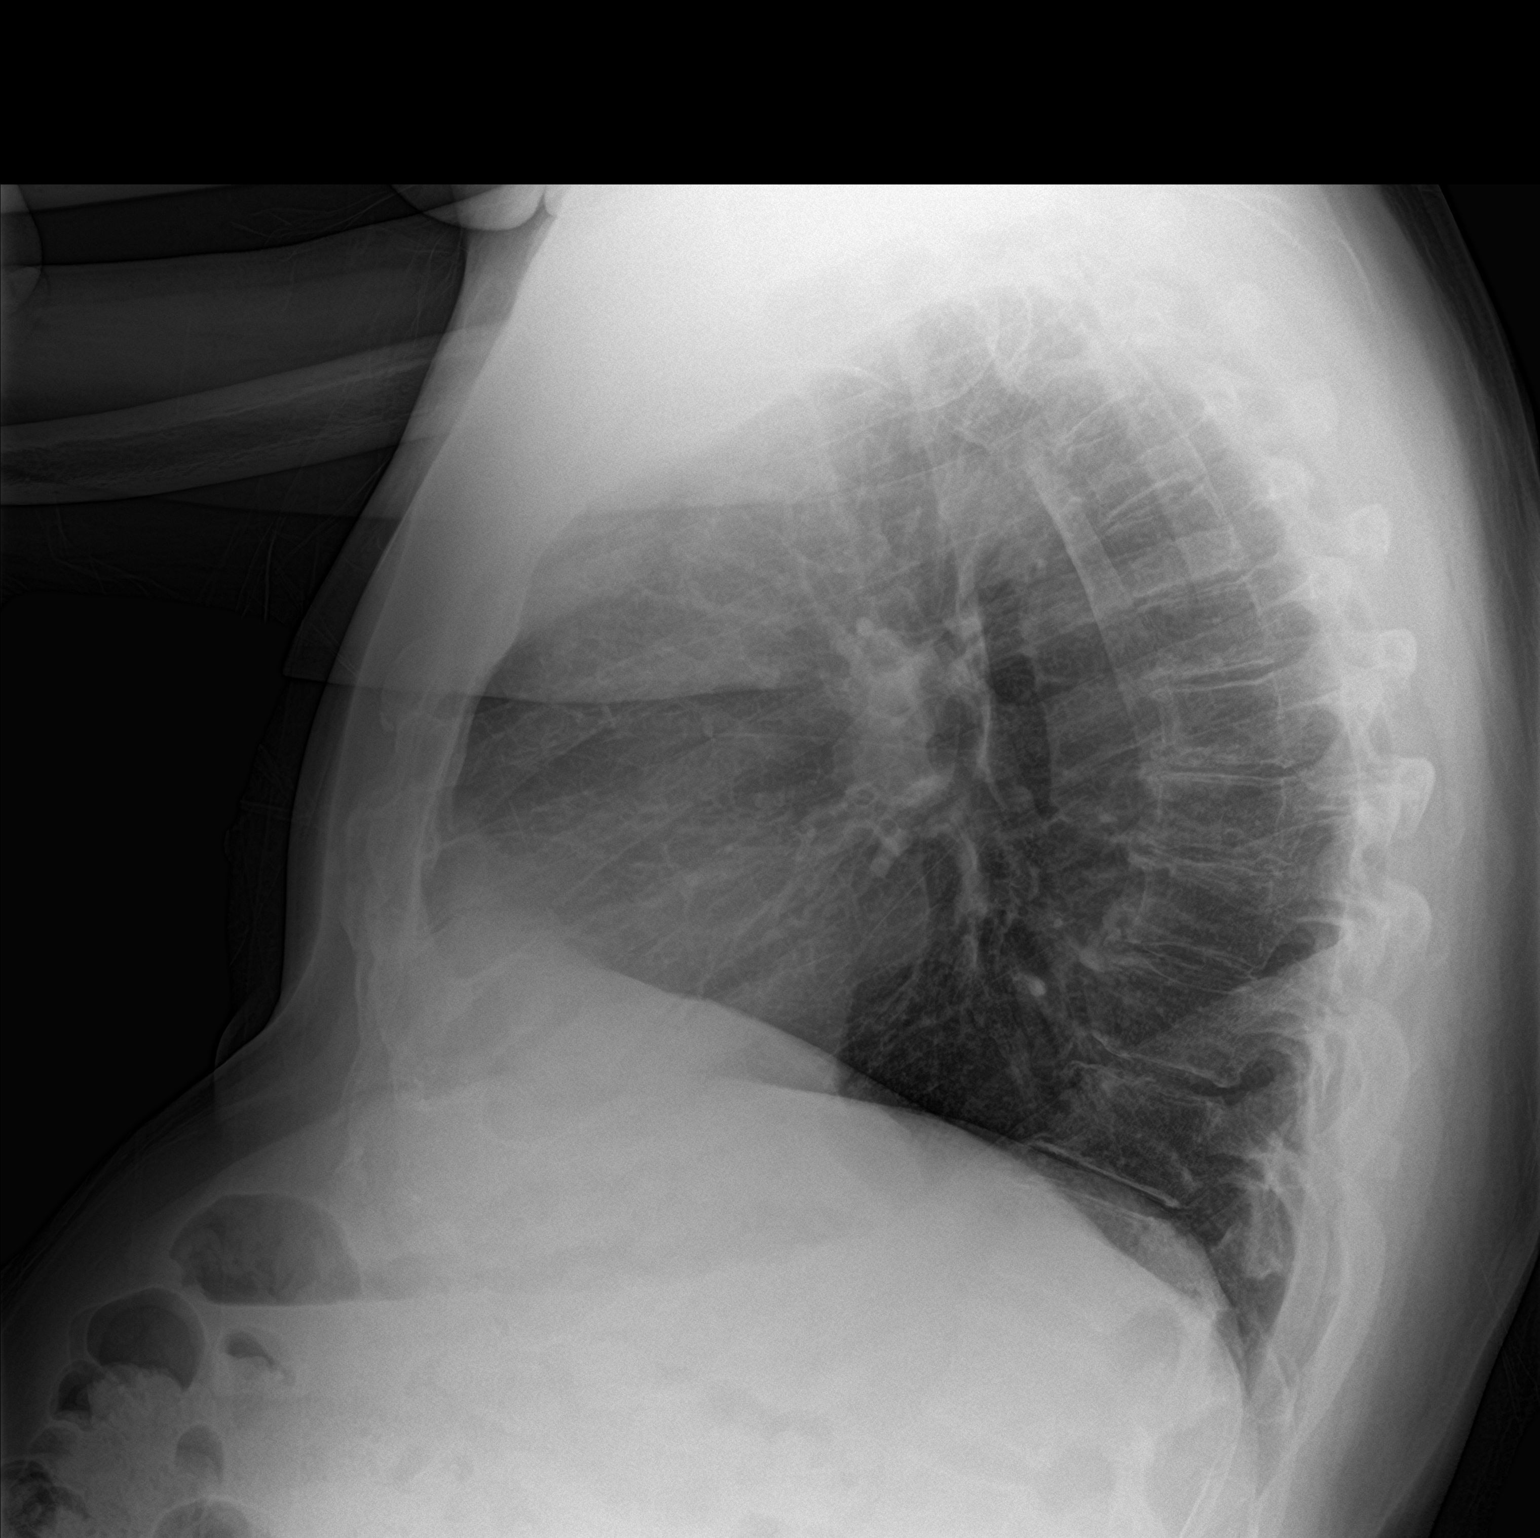

[2 of 2 positions shown; findings below may reference images not displayed]

FINDINGS: Probable small calcified granuloma in the right apex, stable. No
confluent airspace opacities or effusions. Heart is normal size. No
acute bony abnormality.
IMPRESSION: No active cardiopulmonary disease.

## 2019-08-13 ENCOUNTER — Ambulatory Visit: Payer: Non-veteran care | Admitting: Podiatry

## 2019-08-20 ENCOUNTER — Ambulatory Visit: Payer: Non-veteran care | Admitting: Podiatry

## 2020-08-29 ENCOUNTER — Encounter: Payer: Self-pay | Admitting: Podiatry

## 2020-08-29 ENCOUNTER — Ambulatory Visit (INDEPENDENT_AMBULATORY_CARE_PROVIDER_SITE_OTHER): Payer: Medicare Other | Admitting: Podiatry

## 2020-08-29 ENCOUNTER — Other Ambulatory Visit: Payer: Self-pay

## 2020-08-29 DIAGNOSIS — B351 Tinea unguium: Secondary | ICD-10-CM

## 2020-08-29 DIAGNOSIS — M79675 Pain in left toe(s): Secondary | ICD-10-CM

## 2020-08-29 DIAGNOSIS — E1142 Type 2 diabetes mellitus with diabetic polyneuropathy: Secondary | ICD-10-CM

## 2020-08-29 DIAGNOSIS — M79674 Pain in right toe(s): Secondary | ICD-10-CM | POA: Diagnosis not present

## 2020-08-29 NOTE — Progress Notes (Signed)
This patient returns to my office for at risk foot care.  This patient requires this care by a professional since this patient will be at risk due to having diabetic neuropathy.  This patient is unable to cut nails himself since the patient cannot reach his nails.These nails are painful walking and wearing shoes.  This patient presents for at risk foot care today.  General Appearance  Alert, conversant and in no acute stress.  Vascular  Dorsalis pedis and posterior tibial  pulses are minimally  palpable  bilaterally.  Capillary return is within normal limits  Bilaterally.Cold feet noted. bilaterally.  Neurologic  Senn-Weinstein monofilament wire test absent   bilaterally. Muscle power within normal limits bilaterally.  Nails Thick disfigured discolored nails with subungual debris  from hallux to fifth toes bilaterally. No evidence of bacterial infection or drainage bilaterally.  Orthopedic  No limitations of motion  feet .  No crepitus or effusions noted.  No bony pathology or digital deformities noted.  Skin  normotropic skin with no porokeratosis noted bilaterally.  No signs of infections or ulcers noted.     Onychomycosis  Pain in right toes  Pain in left toes  Consent was obtained for treatment procedures.   Mechanical debridement of nails 1-5  bilaterally performed with a nail nipper.  Filed with dremel without incident.  Told him the cramps in his left leg may be due to his vascular status.   Return office visit  4 months                     Told patient to return for periodic foot care and evaluation due to potential at risk complications.   Helane Gunther DPM

## 2020-12-29 ENCOUNTER — Ambulatory Visit (INDEPENDENT_AMBULATORY_CARE_PROVIDER_SITE_OTHER): Payer: No Typology Code available for payment source | Admitting: Podiatry

## 2020-12-29 ENCOUNTER — Encounter: Payer: Self-pay | Admitting: Podiatry

## 2020-12-29 ENCOUNTER — Other Ambulatory Visit: Payer: Self-pay

## 2020-12-29 ENCOUNTER — Ambulatory Visit: Payer: Non-veteran care | Admitting: Podiatry

## 2020-12-29 DIAGNOSIS — M79675 Pain in left toe(s): Secondary | ICD-10-CM

## 2020-12-29 DIAGNOSIS — E1142 Type 2 diabetes mellitus with diabetic polyneuropathy: Secondary | ICD-10-CM

## 2020-12-29 DIAGNOSIS — M79674 Pain in right toe(s): Secondary | ICD-10-CM

## 2020-12-29 DIAGNOSIS — B351 Tinea unguium: Secondary | ICD-10-CM

## 2020-12-29 NOTE — Progress Notes (Signed)
This patient returns to my office for at risk foot care.  This patient requires this care by a professional since this patient will be at risk due to having diabetic neuropathy.  This patient is unable to cut nails himself since the patient cannot reach his nails.These nails are painful walking and wearing shoes.  This patient presents for at risk foot care today.  General Appearance  Alert, conversant and in no acute stress.  Vascular  Dorsalis pedis and posterior tibial  pulses are minimally  palpable  bilaterally.  Capillary return is within normal limits  Bilaterally.Cold feet noted. bilaterally.  Neurologic  Senn-Weinstein monofilament wire test absent   bilaterally. Muscle power within normal limits bilaterally.  Nails Thick disfigured discolored nails with subungual debris  from hallux to fifth toes bilaterally. No evidence of bacterial infection or drainage bilaterally.  Orthopedic  No limitations of motion  feet .  No crepitus or effusions noted.  No bony pathology or digital deformities noted.  Skin  normotropic skin with no porokeratosis noted bilaterally.  No signs of infections or ulcers noted.     Onychomycosis  Pain in right toes  Pain in left toes  Consent was obtained for treatment procedures.   Mechanical debridement of nails 1-5  bilaterally performed with a nail nipper.  Filed with dremel without incident.  Told him the cramps in his left leg may be due to his vascular status.   Return office visit  3 months                     Told patient to return for periodic foot care and evaluation due to potential at risk complications.   Helane Gunther DPM

## 2021-04-03 ENCOUNTER — Ambulatory Visit: Payer: Non-veteran care | Admitting: Podiatry

## 2021-04-05 DIAGNOSIS — E119 Type 2 diabetes mellitus without complications: Secondary | ICD-10-CM | POA: Insufficient documentation

## 2021-04-05 DIAGNOSIS — R0789 Other chest pain: Secondary | ICD-10-CM | POA: Insufficient documentation

## 2021-04-05 DIAGNOSIS — I471 Supraventricular tachycardia: Secondary | ICD-10-CM | POA: Insufficient documentation

## 2021-04-05 DIAGNOSIS — I4719 Other supraventricular tachycardia: Secondary | ICD-10-CM | POA: Insufficient documentation

## 2021-04-05 DIAGNOSIS — G4730 Sleep apnea, unspecified: Secondary | ICD-10-CM | POA: Insufficient documentation

## 2021-04-05 DIAGNOSIS — M7540 Impingement syndrome of unspecified shoulder: Secondary | ICD-10-CM | POA: Insufficient documentation

## 2021-04-05 DIAGNOSIS — I10 Essential (primary) hypertension: Secondary | ICD-10-CM | POA: Insufficient documentation

## 2021-04-05 DIAGNOSIS — E785 Hyperlipidemia, unspecified: Secondary | ICD-10-CM | POA: Insufficient documentation

## 2021-04-05 DIAGNOSIS — J449 Chronic obstructive pulmonary disease, unspecified: Secondary | ICD-10-CM | POA: Insufficient documentation

## 2021-04-05 DIAGNOSIS — R12 Heartburn: Secondary | ICD-10-CM | POA: Insufficient documentation

## 2021-04-05 DIAGNOSIS — M7502 Adhesive capsulitis of left shoulder: Secondary | ICD-10-CM | POA: Insufficient documentation

## 2021-04-05 DIAGNOSIS — R0600 Dyspnea, unspecified: Secondary | ICD-10-CM | POA: Insufficient documentation

## 2021-04-05 DIAGNOSIS — E11319 Type 2 diabetes mellitus with unspecified diabetic retinopathy without macular edema: Secondary | ICD-10-CM | POA: Insufficient documentation

## 2021-04-05 DIAGNOSIS — Z23 Encounter for immunization: Secondary | ICD-10-CM | POA: Insufficient documentation

## 2021-04-06 ENCOUNTER — Other Ambulatory Visit: Payer: Self-pay

## 2021-04-06 ENCOUNTER — Ambulatory Visit: Payer: Non-veteran care | Admitting: Podiatry

## 2022-06-18 ENCOUNTER — Other Ambulatory Visit: Payer: Self-pay

## 2022-06-18 ENCOUNTER — Encounter: Payer: Self-pay | Admitting: Gastroenterology

## 2024-06-29 DEATH — deceased
